# Patient Record
Sex: Male | Born: 1960 | Race: Black or African American | Hispanic: No | Marital: Married | Smoking: Never smoker
Health system: Southern US, Community
[De-identification: ages and names within clinical notes are randomized; demographics above are authoritative.]

---

## 2004-12-31 ENCOUNTER — Emergency Department (HOSPITAL_COMMUNITY): Admission: EM | Admit: 2004-12-31 | Discharge: 2004-12-31 | Payer: Self-pay | Admitting: Emergency Medicine

## 2011-12-15 ENCOUNTER — Other Ambulatory Visit: Payer: Self-pay

## 2013-03-01 ENCOUNTER — Encounter: Payer: Self-pay | Admitting: Family Medicine

## 2013-03-17 ENCOUNTER — Encounter: Payer: Self-pay | Admitting: Family Medicine

## 2013-03-17 ENCOUNTER — Ambulatory Visit (INDEPENDENT_AMBULATORY_CARE_PROVIDER_SITE_OTHER): Payer: 59 | Admitting: Family Medicine

## 2013-03-17 VITALS — BP 104/65 | HR 86 | Temp 98.4°F | Ht 69.0 in | Wt 171.2 lb

## 2013-03-17 DIAGNOSIS — Z13220 Encounter for screening for lipoid disorders: Secondary | ICD-10-CM

## 2013-03-17 DIAGNOSIS — Z8042 Family history of malignant neoplasm of prostate: Secondary | ICD-10-CM | POA: Insufficient documentation

## 2013-03-17 DIAGNOSIS — Z119 Encounter for screening for infectious and parasitic diseases, unspecified: Secondary | ICD-10-CM

## 2013-03-17 DIAGNOSIS — Z0000 Encounter for general adult medical examination without abnormal findings: Secondary | ICD-10-CM | POA: Insufficient documentation

## 2013-03-17 DIAGNOSIS — B353 Tinea pedis: Secondary | ICD-10-CM

## 2013-03-17 LAB — POCT CBC
Granulocyte percent: 76.3 %G (ref 37–80)
HCT, POC: 47.7 % (ref 43.5–53.7)
Hemoglobin: 16.3 g/dL (ref 14.1–18.1)
Lymph, poc: 1 (ref 0.6–3.4)
MCH, POC: 28.6 pg (ref 27–31.2)
MCHC: 34.2 g/dL (ref 31.8–35.4)
MCV: 83.8 fL (ref 80–97)
MPV: 7.6 fL (ref 0–99.8)
POC Granulocyte: 3.7 (ref 2–6.9)
POC LYMPH PERCENT: 21.3 %L (ref 10–50)
Platelet Count, POC: 251 10*3/uL (ref 142–424)
RBC: 5.7 M/uL (ref 4.69–6.13)
RDW, POC: 13.1 %
WBC: 4.9 10*3/uL (ref 4.6–10.2)

## 2013-03-17 MED ORDER — KETOCONAZOLE 2 % EX CREA: TOPICAL_CREAM | Freq: Two times a day (BID) | CUTANEOUS | Status: DC

## 2013-03-17 NOTE — Progress Notes (Signed)
Patient ID: Terry Novak, male   DOB: 1960/07/16, 52 y.o.   MRN: [id number] SUBJECTIVE: CC: Chief Complaint  Patient presents with  . 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 Thinks he may have strained his lower abdomen  History reviewed. No pertinent past medical history. History reviewed. No pertinent past surgical history. History   Social History  . Marital Status: Married    Spouse Name: N/A    Number of Children: N/A  . Years of Education: N/A   Occupational History  . Not on file.   Social History Main Topics  . Smoking status: Never Smoker   . Smokeless tobacco: Not on file  . Alcohol Use: No  . Drug Use: No  . Sexual Activity: Not on file   Other Topics Concern  . Not on file   Social History Narrative  . No narrative on file   Family History  Problem Relation Age of Onset  . Diabetes Mother   . Heart disease Mother    No current outpatient prescriptions on file prior to visit.   No current facility-administered medications on file prior to visit.   Allergies  Allergen Reactions  . Penicillins Rash   Immunization History  Administered Date(s) Administered  . Td 06/30/2010   Prior to Admission medications   Not on File    ROS: As above in the HPI. All other systems are stable or negative.  OBJECTIVE: APPEARANCE:  Patient in no acute distress.The patient appeared well nourished and normally developed. Acyanotic. Waist: VITAL SIGNS:BP 104/65  Pulse 86  Temp(Src) 98.4 F (36.9 C) (Oral)  Ht 5\' 9"  (1.753 m)  Wt 171 lb 3.2 oz (77.656 kg)  BMI 25.27 kg/m2 AAM  SKIN: warm and  Dry without overt rashes, tattoos. Scars from areas of eczema in the past: back of the neck lower extremities and between the shoulder blades. Macerated areas in the webspaces 4th bilaterally reflective of tinea pedis.  HEAD and Neck: without JVD, Head and scalp: normal Eyes:No scleral icterus. Fundi  normal, eye movements normal. Ears: Auricle normal, canal normal, Tympanic membranes normal, insufflation normal. Nose: normal Throat: normal Neck & thyroid: normal  CHEST & LUNGS: Chest wall: normal Lungs: Clear  CVS: Reveals the PMI to be normally located. Regular rhythm, First and Second Heart sounds are normal,  absence of murmurs, rubs or gallops. Peripheral vasculature: Radial pulses: normal Dorsal pedis pulses: normal Posterior pulses: normal  ABDOMEN:  Appearance: normal Benign, no organomegaly, no masses, no Abdominal Aortic enlargement. No Guarding , no rebound. No Bruits. Bowel sounds: normal  RECTAL: Normal: rectal 1+ enlarged heme negative GU: Normal  EXTREMETIES: nonedematous.  MUSCULOSKELETAL:  Spine: normal Joints: intact  NEUROLOGIC: oriented to time,place and person; nonfocal. Strength is normal Sensory is normal Reflexes are normal Cranial Nerves are normal.  ASSESSMENT: Annual physical exam - Plan: CMP14+EGFR, POCT CBC  Tinea pedis - Plan: ketoconazole (NIZORAL) 2 % cream  Family history of prostate cancer - Plan: PSA, total and free  Screening for cholesterol level - Plan: Lipid panel  Screening examination for infectious disease - Plan: Hepatitis C antibody   PLAN:      HEALTH MAINTENANCE Immunizations: Tetanus-Diphtheria Booster due: 2022 Pertusis Booster [id number] Flu Shot Due: every Fall Pneumonia Vaccine: usually at 53 years of age unless there are certain risk situations. Herpes Zoster/Shingles Vaccine due: usually at 53 years of age HPV AOZ:HYQM age 6 to 52 years in males and females.  Healthy Life Habits: Exercise Goal: 5-6 days/week; start gradually(ie 30 minutes/3days per week) 2 days weight training and 3-4 days cardio exercise. Nutrition: Balanced healthy meals including Vegetables and Fruits. Consider  Reading the following books: 1) Eat to Live by Dr Ottis Stain; 2) Prevent and Reverse Heart Disease by Dr Suzzette Righter. 3) The [place] Study  Vitamins:a Multivitamin is okay Aspirin:? Await lab work Stop Tobacco Use: Seat Belt Use:+++ recommended Sunscreen Use:+++ recommended Osteoporosis Prevention: 1) Exercise   Recommended Screening Tests: Colon Cancer Screening: due Blood work: today,psa Cholesterol Screening:  today        HIV:      ?   declined           Hepatitis C(people born 1945-1965):today   Monthly Self Testicular Exam:++++  Eye Exam: every 1 to 2 years recommended Dental Health: at least every 6 months  Others:    Living Will/Healthcare Power of Attorney: should have this in order with your personal estate planning  Return in about 1 year (around 03/17/2014) for PEX.  Wright Gravely P. Modesto Charon, M.D.

## 2013-03-17 NOTE — Patient Instructions (Addendum)
HEALTH MAINTENANCE Immunizations: Tetanus-Diphtheria Booster due: 2022 Pertusis Booster [id number] Flu Shot Due: every Fall Pneumonia Vaccine: usually at 53 years of age unless there are certain risk situations. Herpes Zoster/Shingles Vaccine due: usually at 53 years of age HPV JYN:WGNF age 52 to 19 years in males and females.  Healthy Life Habits: Exercise Goal: 5-6 days/week; start gradually(ie 30 minutes/3days per week) 2 days weight training and 3-4 days cardio exercise. Nutrition: Balanced healthy meals including Vegetables and Fruits. Consider  Reading the following books: 1) Eat to Live by Dr Ottis Stain; 2) Prevent and Reverse Heart Disease by Dr Suzzette Righter. 3) The [place] Study  Vitamins:a Multivitamin is okay Aspirin:? Await lab work Stop Tobacco Use: Seat Belt Use:+++ recommended Sunscreen Use:+++ recommended Osteoporosis Prevention: 1) Exercise   Recommended Screening Tests: Colon Cancer Screening: due Blood work: today,psa Cholesterol Screening:  today        HIV:      ?              Hepatitis C(people born 1945-1965):today   Monthly Self Testicular Exam:++++  Eye Exam: every 1 to 2 years recommended Dental Health: at least every 6 months  Others:    Living Will/Healthcare Power of Attorney: should have this in order with your personal estate planning

## 2013-03-18 ENCOUNTER — Other Ambulatory Visit: Payer: Self-pay | Admitting: Family Medicine

## 2013-03-18 DIAGNOSIS — R972 Elevated prostate specific antigen [PSA]: Secondary | ICD-10-CM

## 2013-03-18 LAB — CMP14+EGFR
ALT: 30 IU/L (ref 0–44)
AST: 26 IU/L (ref 0–40)
Albumin/Globulin Ratio: 1.9 (ref 1.1–2.5)
Albumin: 5 g/dL (ref 3.5–5.5)
Alkaline Phosphatase: 73 IU/L (ref 39–117)
BUN/Creatinine Ratio: 13 (ref 9–20)
BUN: 14 mg/dL (ref 6–24)
CO2: 28 mmol/L (ref 18–29)
Calcium: 9.9 mg/dL (ref 8.7–10.2)
Chloride: 99 mmol/L (ref 97–108)
Creatinine, Ser: 1.07 mg/dL (ref 0.76–1.27)
GFR calc Af Amer: 91 mL/min/{1.73_m2} (ref >59)
GFR calc non Af Amer: 79 mL/min/{1.73_m2} (ref >59)
Globulin, Total: 2.7 g/dL (ref 1.5–4.5)
Glucose: 98 mg/dL (ref 65–99)
Potassium: 4.7 mmol/L (ref 3.5–5.2)
Sodium: 142 mmol/L (ref 134–144)
Total Bilirubin: 1.3 mg/dL — ABNORMAL HIGH (ref 0.0–1.2)
Total Protein: 7.7 g/dL (ref 6.0–8.5)

## 2013-03-18 LAB — PSA, TOTAL AND FREE
PSA, Free Pct: 13.1 %
PSA, Free: 0.55 ng/mL
PSA: 4.2 ng/mL — ABNORMAL HIGH (ref 0.0–4.0)

## 2013-03-18 LAB — LIPID PANEL
Chol/HDL Ratio: 3.8 ratio units (ref 0.0–5.0)
Cholesterol, Total: 211 mg/dL — ABNORMAL HIGH (ref 100–199)
HDL: 56 mg/dL (ref >39)
LDL Calculated: 142 mg/dL — ABNORMAL HIGH (ref 0–99)
Triglycerides: 67 mg/dL (ref 0–149)
VLDL Cholesterol Cal: 13 mg/dL (ref 5–40)

## 2013-03-18 LAB — HEPATITIS C ANTIBODY: Hep C Virus Ab: <0.1 s/co ratio (ref 0.0–0.9)

## 2013-03-18 NOTE — Progress Notes (Signed)
Quick Note:  Labs abnormal. Cholesterol a little high. Needs to get those books recommended and change diet and see me in 6 months to recheck the lipids. Also the PSA is a little high Needs to see a urologist for evaluation. Will refer to Dr Brunilda Payor. ______

## 2013-03-22 ENCOUNTER — Telehealth: Payer: Self-pay | Admitting: Family Medicine

## 2013-03-22 NOTE — Telephone Encounter (Signed)
Pt aware of labs and results released to my chart

## 2013-03-22 NOTE — Telephone Encounter (Signed)
Pt already advised of results.

## 2013-05-02 ENCOUNTER — Telehealth: Payer: Self-pay | Admitting: Family Medicine

## 2013-05-02 NOTE — Telephone Encounter (Signed)
PT NOTIFIED RX SENT IN ELECTRONICALLY ON 03/17/13 BY DR. Modesto Charon

## 2013-05-05 ENCOUNTER — Other Ambulatory Visit: Payer: Self-pay

## 2019-09-08 ENCOUNTER — Other Ambulatory Visit: Payer: Self-pay

## 2019-09-08 ENCOUNTER — Ambulatory Visit (INDEPENDENT_AMBULATORY_CARE_PROVIDER_SITE_OTHER): Payer: BC Managed Care – PPO | Admitting: Internal Medicine

## 2019-09-08 ENCOUNTER — Encounter (INDEPENDENT_AMBULATORY_CARE_PROVIDER_SITE_OTHER): Payer: Self-pay | Admitting: Internal Medicine

## 2019-09-08 VITALS — BP 130/85 | HR 96 | Temp 98.8°F | Ht 70.0 in | Wt 183.0 lb

## 2019-09-08 DIAGNOSIS — E559 Vitamin D deficiency, unspecified: Secondary | ICD-10-CM

## 2019-09-08 DIAGNOSIS — Z131 Encounter for screening for diabetes mellitus: Secondary | ICD-10-CM

## 2019-09-08 DIAGNOSIS — R5381 Other malaise: Secondary | ICD-10-CM | POA: Diagnosis not present

## 2019-09-08 DIAGNOSIS — R972 Elevated prostate specific antigen [PSA]: Secondary | ICD-10-CM | POA: Diagnosis not present

## 2019-09-08 DIAGNOSIS — R5383 Other fatigue: Secondary | ICD-10-CM

## 2019-09-08 DIAGNOSIS — R079 Chest pain, unspecified: Secondary | ICD-10-CM | POA: Diagnosis not present

## 2019-09-08 DIAGNOSIS — Z13220 Encounter for screening for lipoid disorders: Secondary | ICD-10-CM

## 2019-09-08 NOTE — Progress Notes (Signed)
Metrics: Intervention Frequency ACO  Documented Smoking Status Yearly  Screened one or more times in 24 months  Cessation Counseling or  Active cessation medication Past 24 months  Past 24 months   Guideline developer: UpToDate (See UpToDate for funding source) Date Released: 2014       Wellness Office Visit  Subjective:  Patient ID: Terry Novak, male    DOB: [birth year]  Age: 59 y.o. MRN: [id number]  CC: This 59 year old man comes to our practice as a new patient to be established. He recently had chest pain that he is somewhat concerned about.  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  Each time he had the pain it lasted only a few minutes and the severity of the pain was 2 out of 10.  It was not associated with dyspnea, sweating, nausea or vomiting.  There was no radiation of the pain.  He was concerned that he went to the urgent care.  Apparently her D-dimer was elevated so he was told to go to the emergency room, [facility].  D-dimer the hospital was normal.  He proceeded to have this intermittent chest pain for the next couple of days over the weekend and then he has not had any further. 2 days prior to the onset of the pain, he had a heavy strength training workout. There is a family history of diabetes and heart disease in his mother.  He wanted to make sure he was not having cardiac problems. In terms of his general health, he tells me he has had a history of elevated PSA more than 5 years ago and eventually had a prostate biopsy which was negative.  History reviewed. No pertinent past medical history.    Family History  Problem Relation Age of Onset  . Diabetes Mother   . Heart disease Mother   . Stroke Mother   . Migraines Father   . 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   Social History   Tobacco Use  . Smoking status: Never Smoker  . Smokeless tobacco: Never Used  Substance Use Topics  . Alcohol use: Yes    Alcohol/week: 1.0 standard drinks    Types: 1 Glasses of wine per week    Comment: weekend occasional    No outpatient medications have been marked as taking for the 09/08/19 encounter (Office Visit) with Doree Albee, MD.       Objective:   Today's Vitals: BP 130/85 (BP Location: Left Arm, Patient Position: Sitting, Cuff Size: Normal)   Pulse 96   Temp 98.8 F (37.1 C) (Temporal)   Ht 5\' 10"  (1.778 m)   Wt 183 lb (83 kg)   SpO2 96%   BMI 26.26 kg/m  Vitals with BMI 09/08/2019 03/17/2013  Height 5\' 10"  5\' 9"   Weight 183 lbs 171 lbs 3 oz  BMI [id number]  Systolic [id number]  Diastolic 85 65  Pulse 96 86     Physical Exam   He looks systemically well.  Heart sounds are present without gallop rhythm.  There are no murmurs.  Diastolic blood pressure slightly elevated.  There is no anterior chest wall tenderness.  Lung fields are clear with no pleural rub.  There  is no neck lymphadenopathy.    Assessment   1. Elevated PSA   2. Chest pain, unspecified type   3. Vitamin D deficiency disease   4. Malaise and fatigue   5. Screening for diabetes mellitus   6. Screening for lipoid disorders       Tests ordered Orders Placed This Encounter  Procedures  . COMPLETE METABOLIC PANEL WITH GFR  . Hemoglobin A1c  . Lipid panel  . PSA, total and free  . T3, free  . T4  . TSH  . VITAMIN D 25 Hydroxy (Vit-D Deficiency, Fractures)     Plan: 1. An EKG was done in the office which shows normal sinus rhythm without any acute ST-T wave changes. 2. Blood work is taken above. 3. Further recommendations will depend on these results and I will see him for follow-up in the next few weeks to discuss all these results. 4. I spent 30 minutes with this patient today discussing his  symptoms.   No orders of the defined types were placed in this encounter.   Doree Albee, MD

## 2019-09-09 LAB — LIPID PANEL
Cholesterol: 203 mg/dL — ABNORMAL HIGH (ref <200)
HDL: 44 mg/dL (ref >=40)
LDL Cholesterol (Calc): 122 mg/dL (calc) — ABNORMAL HIGH
Non-HDL Cholesterol (Calc): 159 mg/dL (calc) — ABNORMAL HIGH (ref <130)
Total CHOL/HDL Ratio: 4.6 (calc) (ref <5.0)
Triglycerides: 260 mg/dL — ABNORMAL HIGH (ref <150)

## 2019-09-09 LAB — TSH: TSH: 1.17 mIU/L (ref 0.40–4.50)

## 2019-09-09 LAB — PSA, TOTAL AND FREE
PSA, Free: 1.3 ng/mL
PSA, Total: 16.8 ng/mL — ABNORMAL HIGH (ref <=4.0)

## 2019-09-09 LAB — COMPLETE METABOLIC PANEL WITH GFR
AG Ratio: 1.6 (calc) (ref 1.0–2.5)
ALT: 50 U/L — ABNORMAL HIGH (ref 9–46)
AST: 32 U/L (ref 10–35)
Albumin: 4.6 g/dL (ref 3.6–5.1)
Alkaline phosphatase (APISO): 87 U/L (ref 35–144)
BUN: 13 mg/dL (ref 7–25)
CO2: 32 mmol/L (ref 20–32)
Calcium: 9.5 mg/dL (ref 8.6–10.3)
Chloride: 99 mmol/L (ref 98–110)
Creat: 1.16 mg/dL (ref 0.70–1.33)
GFR, Est African American: 79 mL/min/{1.73_m2} (ref >=60)
GFR, Est Non African American: 69 mL/min/{1.73_m2} (ref >=60)
Globulin: 2.9 g/dL (calc) (ref 1.9–3.7)
Glucose, Bld: 82 mg/dL (ref 65–99)
Potassium: 4 mmol/L (ref 3.5–5.3)
Sodium: 138 mmol/L (ref 135–146)
Total Bilirubin: 0.9 mg/dL (ref 0.2–1.2)
Total Protein: 7.5 g/dL (ref 6.1–8.1)

## 2019-09-09 LAB — T3, FREE: T3, Free: 3 pg/mL (ref 2.3–4.2)

## 2019-09-09 LAB — T4: T4, Total: 7 ug/dL (ref 4.9–10.5)

## 2019-09-09 LAB — HEMOGLOBIN A1C
Hgb A1c MFr Bld: 5.7 % of total Hgb — ABNORMAL HIGH (ref <5.7)
Mean Plasma Glucose: 117 (calc)
eAG (mmol/L): 6.5 (calc)

## 2019-09-09 LAB — VITAMIN D 25 HYDROXY (VIT D DEFICIENCY, FRACTURES): Vit D, 25-Hydroxy: 17 ng/mL — ABNORMAL LOW (ref 30–100)

## 2019-09-12 ENCOUNTER — Other Ambulatory Visit (INDEPENDENT_AMBULATORY_CARE_PROVIDER_SITE_OTHER): Payer: Self-pay | Admitting: Internal Medicine

## 2019-09-12 ENCOUNTER — Ambulatory Visit (INDEPENDENT_AMBULATORY_CARE_PROVIDER_SITE_OTHER): Payer: Self-pay | Admitting: Internal Medicine

## 2019-09-12 ENCOUNTER — Telehealth (INDEPENDENT_AMBULATORY_CARE_PROVIDER_SITE_OTHER): Payer: Self-pay

## 2019-09-12 ENCOUNTER — Other Ambulatory Visit (INDEPENDENT_AMBULATORY_CARE_PROVIDER_SITE_OTHER): Payer: Self-pay

## 2019-09-12 DIAGNOSIS — R972 Elevated prostate specific antigen [PSA]: Secondary | ICD-10-CM

## 2019-09-12 NOTE — Telephone Encounter (Signed)
Notified pt that his lab results for PSA is elevated to 16.8 and I will be sending a Referral to Dr Irine Seal office in [place] to be seen for a consult. I have sent over referral to [facility] today.

## 2019-09-12 NOTE — Progress Notes (Signed)
Patient called.  Left voicemail to contact office.

## 2019-09-12 NOTE — Progress Notes (Signed)
Please call this patient today and let him know that his PSA level is extremely elevated and I am concerned about the possibility of prostate cancer. Please refer him today to urology for further evaluation of elevated PSA. I will put the order in for the referral.

## 2019-09-12 NOTE — Telephone Encounter (Signed)
DONE

## 2019-09-16 ENCOUNTER — Other Ambulatory Visit: Payer: Self-pay | Admitting: Urology

## 2019-09-16 DIAGNOSIS — Z8042 Family history of malignant neoplasm of prostate: Secondary | ICD-10-CM

## 2019-09-16 DIAGNOSIS — R972 Elevated prostate specific antigen [PSA]: Secondary | ICD-10-CM

## 2019-09-26 ENCOUNTER — Ambulatory Visit (INDEPENDENT_AMBULATORY_CARE_PROVIDER_SITE_OTHER): Payer: Self-pay | Admitting: Internal Medicine

## 2019-10-13 ENCOUNTER — Ambulatory Visit (INDEPENDENT_AMBULATORY_CARE_PROVIDER_SITE_OTHER): Payer: BC Managed Care – PPO | Admitting: Internal Medicine

## 2019-10-15 ENCOUNTER — Other Ambulatory Visit: Payer: Self-pay

## 2019-10-15 ENCOUNTER — Ambulatory Visit
Admission: RE | Admit: 2019-10-15 | Discharge: 2019-10-15 | Disposition: A | Discharge status: Home / Self Care | Payer: BC Managed Care – PPO | Source: Ambulatory Visit | Attending: Urology | Admitting: Urology

## 2019-10-15 DIAGNOSIS — R972 Elevated prostate specific antigen [PSA]: Secondary | ICD-10-CM

## 2019-10-15 DIAGNOSIS — Z8042 Family history of malignant neoplasm of prostate: Secondary | ICD-10-CM

## 2019-10-15 MED ORDER — GADOBENATE DIMEGLUMINE 529 MG/ML IV SOLN
17.0000 mL | Freq: Once | INTRAVENOUS | Status: AC | PRN
  Administered 2019-10-15: 17 mL via INTRAVENOUS

## 2019-10-19 ENCOUNTER — Telehealth (INDEPENDENT_AMBULATORY_CARE_PROVIDER_SITE_OTHER): Payer: Self-pay

## 2019-10-19 NOTE — Telephone Encounter (Signed)
The report that I am reading actually suggest he does not have prostate cancer.  I do not mind doing a telemedicine visit but the urologist is the person who should be telling him about the MRI result and making further recommendations.  From my reading, he does not have prostate cancer and has BPH.

## 2019-10-19 NOTE — Telephone Encounter (Signed)
Dr Irine Seal

## 2019-10-19 NOTE — Telephone Encounter (Signed)
Terry Novak,pt  ask if you would send a message on his behalf. (cause all this medical terms  he don't quite understand) He said he does not want to do a biopsy procedure if he really does not need. Reason is because if it is no Cancer, then lets do other treatment instead or monitor. If possible, does not want to be a bill for this if he does not need. Just feel better if Dr Anastasio Champion will talk to Urologist for him.  Will we let him know the outcome. Pt is very nervious and unsure of information on his own.

## 2019-10-19 NOTE — Telephone Encounter (Signed)
Okay, I need the urologist name and phone number and I will call him.

## 2019-11-17 ENCOUNTER — Other Ambulatory Visit: Payer: Self-pay

## 2019-11-17 ENCOUNTER — Encounter (INDEPENDENT_AMBULATORY_CARE_PROVIDER_SITE_OTHER): Payer: Self-pay | Admitting: Internal Medicine

## 2019-11-17 ENCOUNTER — Ambulatory Visit (INDEPENDENT_AMBULATORY_CARE_PROVIDER_SITE_OTHER): Payer: BC Managed Care – PPO | Admitting: Internal Medicine

## 2019-11-17 VITALS — BP 124/88 | HR 94 | Temp 97.2°F | Ht 71.0 in | Wt 183.6 lb

## 2019-11-17 DIAGNOSIS — E559 Vitamin D deficiency, unspecified: Secondary | ICD-10-CM | POA: Diagnosis not present

## 2019-11-17 DIAGNOSIS — R972 Elevated prostate specific antigen [PSA]: Secondary | ICD-10-CM | POA: Diagnosis not present

## 2019-11-17 DIAGNOSIS — R7303 Prediabetes: Secondary | ICD-10-CM

## 2019-11-17 DIAGNOSIS — Z1211 Encounter for screening for malignant neoplasm of colon: Secondary | ICD-10-CM

## 2019-11-17 NOTE — Progress Notes (Signed)
Metrics: Intervention Frequency ACO  Documented Smoking Status Yearly  Screened one or more times in 24 months  Cessation Counseling or  Active cessation medication Past 24 months  Past 24 months   Guideline developer: UpToDate (See UpToDate for funding source) Date Released: 2014       Wellness Office Visit  Subjective:  Patient ID: Terry Novak, male    DOB: 07-25-1960  Age: 59 y.o. MRN: [id number]  CC: This man comes in to review all his blood work and further recommendations. HPI  I have tried seen him for the first visit, the most pressing problem was an elevated PSA.  He eventually went to see a urologist and had a biopsy which thankfully was negative.  He also had an MRI of the prostate which did not show any evidence of prostate cancer. He is here today to also discuss the remaining of his blood work.  I discussed all the blood results with him.  He has vitamin D deficiency. He is prediabetic with a hemoglobin A1c of 5.7%. He also has mild hyperlipidemia which does not require statin therapy. Remaining blood work is okay. History reviewed. No pertinent past medical history.    Family History  Problem Relation Age of Onset  . Diabetes Mother   . Heart disease Mother   . Stroke Mother   . Migraines Father   . 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   Social History   Tobacco Use  . Smoking status: Never Smoker  . Smokeless tobacco: Never Used  Substance Use Topics  . Alcohol use: Yes    Alcohol/week: 1.0 standard drinks    Types: 1 Glasses of wine per week    Comment: weekend occasional    No outpatient medications have been marked as taking for the 11/17/19 encounter (Office Visit) with Doree Albee, MD.      Depression screen [facility] 2/9 09/08/2019  Decreased Interest 0  Down, Depressed, Hopeless 0  PHQ - 2 Score 0      Objective:   Today's Vitals: BP 124/88 (BP Location: Left Arm, Patient Position: Sitting, Cuff Size: Normal)   Pulse 94   Temp (!) 97.2 F (36.2 C) (Temporal)   Ht 5\' 11"  (1.803 m)   Wt 183 lb 9.6 oz (83.3 kg)   SpO2 96%   BMI 25.61 kg/m  Vitals with BMI 11/17/2019 09/08/2019 03/17/2013  Height 5\' 11"  5\' 10"  5\' 9"   Weight 183 lbs 10 oz 183 lbs 171 lbs 3 oz  BMI 25.62 [id number]  Systolic [id number]  Diastolic 88 85 65  Pulse 94 96 86     Physical Exam   He looks systemically well.  Weight is unchanged.  Blood pressure is in reasonable control although diastolic slightly elevated for him.  He is alert and orientated without any focal neurological signs.    Assessment   1. Vitamin D deficiency disease   2. Elevated PSA   3. Prediabetes   4. Colon cancer screening       Tests ordered Orders Placed This Encounter  Procedures  . Ambulatory referral to Gastroenterology     Plan: 1. I recommended he start taking vitamin D3 10,000 units daily. 2. As far as his elevated PSA is concerned, he will follow-up with urology. 3. We discussed his prediabetes and his  mother had diabetes also and his desire to avoid diabetes.  We discussed nutrition at length and I introduced him to the concept of intermittent fasting combined with a plant-based diet.  He will try to do this. 4. I will refer him to gastroenterology for colonoscopy. 5. I will see him in about 3 months time and we will see how he is doing then and check blood work again. 6. Today I spent 30 minutes with this patient discussing all of the above.   No orders of the defined types were placed in this encounter.   Doree Albee, MD

## 2019-11-17 NOTE — Patient Instructions (Signed)
Terry Novak Terry Novak What to Avoid . Avoid added sugars o Often added sugar can be found in processed foods such as many condiments, dry cereals, cakes, cookies, chips, crisps, crackers, candies, sweetened drinks, etc.  o Read labels and AVOID/DECREASE use of foods with the following in their ingredient list: Sugar, fructose, high fructose corn syrup, sucrose, glucose, maltose, dextrose, molasses, cane sugar, brown sugar, any type of syrup, agave nectar, etc.   . Avoid snacking in between meals . Avoid foods made with flour o If you are going to eat food made with flour, choose those made with whole-grains; and, minimize your consumption as much as is tolerable . Avoid processed foods o These foods are generally stocked in the middle of the grocery store. Focus on shopping on the perimeter of the grocery.  . Avoid Meat  o We recommend following a plant-based diet at [facility]. Thus, we recommend avoiding meat as a general rule. Consider eating beans, legumes, eggs, and/or dairy products for regular protein sources o If you plan on eating meat limit to 4 ounces of meat at a time and choose lean options such as Fish, chicken, turkey. Avoid red meat intake such as pork and/or steak What to Include . Vegetables o GREEN LEAFY VEGETABLES: Kale, spinach, mustard greens, collard greens, cabbage, broccoli, etc. o OTHER: Asparagus, cauliflower, eggplant, carrots, peas, Brussel sprouts, tomatoes, bell peppers, zucchini, beets, cucumbers, etc. . Grains, seeds, and legumes o Beans: kidney beans, black eyed peas, garbanzo beans, black beans, pinto beans, etc. o Whole, unrefined grains: brown rice, barley, bulgur, oatmeal, etc. . Healthy fats  o Avoid highly processed fats such as vegetable oil o Examples of healthy fats: avocado, olives, virgin olive oil, dark chocolate (?72% Cocoa), nuts (peanuts, almonds, walnuts, cashews, pecans, etc.) . None to Low  Intake of Animal Sources of Protein o Meat sources: chicken, turkey, salmon, tuna. Limit to 4 ounces of meat at one time. o Consider limiting dairy sources, but when choosing dairy focus on: PLAIN Greek yogurt, cottage cheese, high-protein milk . Fruit o Choose berries  When to Eat . Intermittent Fasting: o Choosing not to eat for a specific time period, but DO FOCUS ON HYDRATION when fasting o Multiple Techniques: - Time Restricted Eating: eat 3 meals in a day, each meal lasting no more than 60 minutes, no snacks between meals - 16-18 hour fast: fast for 16 to 18 hours up to 7 days a week. Often suggested to start with 2-3 nonconsecutive days per week.  . Remember the time you sleep is counted as fasting.  . Examples of eating schedule: Fast from 7:00pm-11:00am. Eat between 11:00am-7:00pm.  - 24-hour fast: fast for 24 hours up to every other day. Often suggested to start with 1 day per week . Remember the time you sleep is counted as fasting . Examples of eating schedule:  o Eating day: eat 2-3 meals on your eating day. If doing 2 meals, each meal should last no more than 90 minutes. If doing 3 meals, each meal should last no more than 60 minutes. Finish last meal by 7:00pm. o Fasting day: Fast until 7:00pm.  o IF YOU FEEL UNWELL FOR ANY REASON/IN ANY WAY WHEN FASTING, STOP FASTING BY EATING A NUTRITIOUS SNACK OR LIGHT MEAL o ALWAYS FOCUS ON HYDRATION DURING FASTS - Acceptable Hydration sources: water, broths, tea/coffee (black tea/coffee is best but using a small amount of whole-fat dairy products in coffee/tea is acceptable).  -   Poor Hydration Sources: anything with sugar or artificial sweeteners added to it  These recommendations have been developed for patients that are actively receiving medical care from either Terry Novak or Terry Gray, DNP, NP-C at [facility]. These recommendations are developed for patients with specific medical conditions and are not meant to be  distributed or used by others that are not actively receiving care from either provider listed above at [name]. It is not appropriate to participate in the above eating plans without proper medical supervision.   Reference: Fung, J. The obesity code. [place]: [place]; 2016.   

## 2019-11-21 ENCOUNTER — Encounter (INDEPENDENT_AMBULATORY_CARE_PROVIDER_SITE_OTHER): Payer: Self-pay | Admitting: *Deleted

## 2020-02-20 ENCOUNTER — Ambulatory Visit (INDEPENDENT_AMBULATORY_CARE_PROVIDER_SITE_OTHER): Payer: BC Managed Care – PPO | Admitting: Internal Medicine

## 2020-04-02 ENCOUNTER — Telehealth (INDEPENDENT_AMBULATORY_CARE_PROVIDER_SITE_OTHER): Payer: Self-pay

## 2020-04-02 ENCOUNTER — Encounter (INDEPENDENT_AMBULATORY_CARE_PROVIDER_SITE_OTHER): Payer: Self-pay | Admitting: Internal Medicine

## 2020-07-18 ENCOUNTER — Encounter (INDEPENDENT_AMBULATORY_CARE_PROVIDER_SITE_OTHER): Payer: Self-pay | Admitting: Internal Medicine

## 2020-07-26 ENCOUNTER — Encounter (INDEPENDENT_AMBULATORY_CARE_PROVIDER_SITE_OTHER): Payer: Self-pay | Admitting: Internal Medicine

## 2020-07-26 ENCOUNTER — Other Ambulatory Visit: Payer: Self-pay

## 2020-07-26 ENCOUNTER — Ambulatory Visit (INDEPENDENT_AMBULATORY_CARE_PROVIDER_SITE_OTHER): Payer: BC Managed Care – PPO | Admitting: Internal Medicine

## 2020-07-26 VITALS — BP 132/80 | HR 112 | Temp 96.1°F | Ht 71.0 in | Wt 173.6 lb

## 2020-07-26 DIAGNOSIS — E559 Vitamin D deficiency, unspecified: Secondary | ICD-10-CM | POA: Diagnosis not present

## 2020-07-26 DIAGNOSIS — E785 Hyperlipidemia, unspecified: Secondary | ICD-10-CM | POA: Diagnosis not present

## 2020-07-26 DIAGNOSIS — R972 Elevated prostate specific antigen [PSA]: Secondary | ICD-10-CM | POA: Diagnosis not present

## 2020-07-26 DIAGNOSIS — Z1211 Encounter for screening for malignant neoplasm of colon: Secondary | ICD-10-CM

## 2020-07-26 DIAGNOSIS — R7303 Prediabetes: Secondary | ICD-10-CM | POA: Diagnosis not present

## 2020-07-26 NOTE — Progress Notes (Signed)
Metrics: Intervention Frequency ACO  Documented Smoking Status Yearly  Screened one or more times in 24 months  Cessation Counseling or  Active cessation medication Past 24 months  Past 24 months   Guideline developer: UpToDate (See UpToDate for funding source) Date Released: 2014       Wellness Office Visit  Subjective:  Patient ID: Terry Novak, male    DOB: [birth year]  Age: 60 y.o. MRN: [id number]  CC: This man comes in for follow-up of vitamin D deficiency, prediabetes, dyslipidemia and elevated PSA. HPI  Approximately 2 weeks ago he had COVID-19 disease and thankfully has made a good recovery.  He has had 2 doses of COVID-19 vaccine and is due to get the third dose now in about 2 to 3 weeks time is what I would recommend to him. He has been working hard on nutrition and as a result has lost 10 pounds in the last time I saw him in May of last year. He has been taking vitamin D3 5000 units daily for vitamin D deficiency. History reviewed. No pertinent past medical history. History reviewed. No pertinent surgical history.   Family History  Problem Relation Age of Onset  . Diabetes Mother   . Heart disease Mother   . Stroke Mother   . Migraines Father   . 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   Social History   Tobacco Use  . Smoking status: Never Smoker  . Smokeless tobacco: Never Used  Substance Use Topics  . Alcohol use: Yes    Alcohol/week: 1.0 standard drink    Types: 1 Glasses of wine per week    Comment: weekend occasional    Current Meds  Medication Sig  . Cholecalciferol (VITAMIN D-3) 125 MCG (5000 UT) TABS Take 1 tablet by mouth daily.      Depression screen [facility] 2/9 07/26/2020 09/08/2019  Decreased Interest 0 0  Down, Depressed, Hopeless 0 0  PHQ - 2 Score 0 0  Altered sleeping 0 -  Tired, decreased energy 0 -   Change in appetite 0 -  Feeling bad or failure about yourself  0 -  Trouble concentrating 0 -  Moving slowly or fidgety/restless 0 -  Suicidal thoughts 0 -  PHQ-9 Score 0 -  Difficult doing work/chores Not difficult at all -     Objective:   Today's Vitals: BP 132/80   Pulse (!) 112   Temp (!) 96.1 F (35.6 C) (Temporal)   Ht 5\' 11"  (1.803 m)   Wt 173 lb 9.6 oz (78.7 kg)   SpO2 98%   BMI 24.21 kg/m  Vitals with BMI 07/26/2020 11/17/2019 09/08/2019  Height 5\' 11"  5\' 11"  5\' 10"   Weight 173 lbs 10 oz 183 lbs 10 oz 183 lbs  BMI 24.22 01.02 72.53  Systolic 664 403 474  Diastolic 80 88 85  Pulse 259 94 96     Physical Exam  He looks systemically well.  He has lost 10 pound since last visit.  Blood pressure is in a good range.     Assessment   1. Vitamin D deficiency disease   2. Prediabetes   3. Elevated PSA   4. Dyslipidemia   5. Colon cancer screening       Tests ordered Orders Placed This Encounter  Procedures  . Lipid panel  .  Hemoglobin A1c  . COMPLETE METABOLIC PANEL WITH GFR  . VITAMIN D 25 Hydroxy (Vit-D Deficiency, Fractures)  . Ambulatory referral to Gastroenterology     Plan: 1. He will continue with vitamin D3 5000 units daily. 2. We will check blood work for A1c and lipid panel. 3. He needs a colonoscopy and we will refer him to the gastroenterologist. 4. Follow-up in about 3 to 4 months for an annual physical exam.   No orders of the defined types were placed in this encounter.   Doree Albee, MD

## 2020-07-27 ENCOUNTER — Encounter (INDEPENDENT_AMBULATORY_CARE_PROVIDER_SITE_OTHER): Payer: Self-pay | Admitting: *Deleted

## 2020-07-27 LAB — LIPID PANEL
Cholesterol: 223 mg/dL — ABNORMAL HIGH (ref <200)
HDL: 42 mg/dL (ref >=40)
LDL Cholesterol (Calc): 146 mg/dL (calc) — ABNORMAL HIGH
Non-HDL Cholesterol (Calc): 181 mg/dL (calc) — ABNORMAL HIGH (ref <130)
Total CHOL/HDL Ratio: 5.3 (calc) — ABNORMAL HIGH (ref <5.0)
Triglycerides: 211 mg/dL — ABNORMAL HIGH (ref <150)

## 2020-07-27 LAB — COMPLETE METABOLIC PANEL WITH GFR
AG Ratio: 1.4 (calc) (ref 1.0–2.5)
ALT: 47 U/L — ABNORMAL HIGH (ref 9–46)
AST: 25 U/L (ref 10–35)
Albumin: 4.5 g/dL (ref 3.6–5.1)
Alkaline phosphatase (APISO): 78 U/L (ref 35–144)
BUN/Creatinine Ratio: 13 (calc) (ref 6–22)
BUN: 16 mg/dL (ref 7–25)
CO2: 31 mmol/L (ref 20–32)
Calcium: 10 mg/dL (ref 8.6–10.3)
Chloride: 101 mmol/L (ref 98–110)
Creat: 1.28 mg/dL — ABNORMAL HIGH (ref 0.70–1.25)
GFR, Est African American: 70 mL/min/{1.73_m2} (ref >=60)
GFR, Est Non African American: 60 mL/min/{1.73_m2} (ref >=60)
Globulin: 3.3 g/dL (calc) (ref 1.9–3.7)
Glucose, Bld: 87 mg/dL (ref 65–139)
Potassium: 4.1 mmol/L (ref 3.5–5.3)
Sodium: 140 mmol/L (ref 135–146)
Total Bilirubin: 1.4 mg/dL — ABNORMAL HIGH (ref 0.2–1.2)
Total Protein: 7.8 g/dL (ref 6.1–8.1)

## 2020-07-27 LAB — HEMOGLOBIN A1C
Hgb A1c MFr Bld: 5.8 % of total Hgb — ABNORMAL HIGH (ref <5.7)
Mean Plasma Glucose: 120 mg/dL
eAG (mmol/L): 6.6 mmol/L

## 2020-07-27 LAB — VITAMIN D 25 HYDROXY (VIT D DEFICIENCY, FRACTURES): Vit D, 25-Hydroxy: 23 ng/mL — ABNORMAL LOW (ref 30–100)

## 2020-10-16 ENCOUNTER — Encounter (INDEPENDENT_AMBULATORY_CARE_PROVIDER_SITE_OTHER): Payer: Self-pay | Admitting: *Deleted

## 2020-10-16 ENCOUNTER — Telehealth (INDEPENDENT_AMBULATORY_CARE_PROVIDER_SITE_OTHER): Payer: Self-pay | Admitting: *Deleted

## 2020-10-16 ENCOUNTER — Other Ambulatory Visit (INDEPENDENT_AMBULATORY_CARE_PROVIDER_SITE_OTHER): Payer: Self-pay | Admitting: *Deleted

## 2020-10-16 MED ORDER — PEG 3350-KCL-NA BICARB-NACL 420 G PO SOLR: 4000.0000 mL | Freq: Once | ORAL | 0 refills | Status: AC

## 2020-10-16 NOTE — Telephone Encounter (Signed)
Patient needs trilyte 

## 2020-10-16 NOTE — Telephone Encounter (Signed)
done

## 2020-10-17 ENCOUNTER — Telehealth (INDEPENDENT_AMBULATORY_CARE_PROVIDER_SITE_OTHER): Payer: Self-pay | Admitting: *Deleted

## 2020-10-17 ENCOUNTER — Other Ambulatory Visit (INDEPENDENT_AMBULATORY_CARE_PROVIDER_SITE_OTHER): Payer: Self-pay

## 2020-10-17 DIAGNOSIS — Z1211 Encounter for screening for malignant neoplasm of colon: Secondary | ICD-10-CM

## 2020-10-17 NOTE — Telephone Encounter (Signed)
Referring MD/PCP: gosrani  Procedure: tcs  Reason/Indication:  screening  Has patient had this procedure before?  no  If so, when, by whom and where?    Is there a family history of colon cancer?  no  Who?  What age when diagnosed?    Is patient diabetic? If yes, Type 1 or Type 2   no      Does patient have prosthetic heart valve or mechanical valve?  no  Do you have a pacemaker/defibrillator?  no  Has patient ever had endocarditis/atrial fibrillation? no  Have you had a stroke/heart attack last 6 mths? no  Does patient use oxygen? no  Has patient had joint replacement within last 12 months?  no  Is patient constipated or do they take laxatives? no  Does patient have a history of alcohol/drug use?  no  Is patient on blood thinner such as Coumadin, Plavix and/or Aspirin? no  Do you take medicine for weight loss?  no  For male patients,: do you still have your menstrual cycle? n/a  Medications: none  Allergies:   Medication Adjustment per Dr Rehman/Dr Jenetta Downer   Procedure date & time: 11/15/20

## 2020-11-13 ENCOUNTER — Other Ambulatory Visit (HOSPITAL_COMMUNITY)
Admission: RE | Admit: 2020-11-13 | Discharge: 2020-11-13 | Disposition: A | Discharge status: Home / Self Care | Payer: BC Managed Care – PPO | Source: Ambulatory Visit | Attending: Internal Medicine | Admitting: Internal Medicine

## 2020-11-13 ENCOUNTER — Other Ambulatory Visit: Payer: Self-pay

## 2020-11-13 DIAGNOSIS — Z79899 Other long term (current) drug therapy: Secondary | ICD-10-CM | POA: Diagnosis not present

## 2020-11-13 DIAGNOSIS — Z01812 Encounter for preprocedural laboratory examination: Secondary | ICD-10-CM | POA: Insufficient documentation

## 2020-11-13 DIAGNOSIS — Z823 Family history of stroke: Secondary | ICD-10-CM | POA: Diagnosis not present

## 2020-11-13 DIAGNOSIS — Z880 Allergy status to penicillin: Secondary | ICD-10-CM | POA: Diagnosis not present

## 2020-11-13 DIAGNOSIS — D123 Benign neoplasm of transverse colon: Secondary | ICD-10-CM | POA: Diagnosis not present

## 2020-11-13 DIAGNOSIS — Z1211 Encounter for screening for malignant neoplasm of colon: Secondary | ICD-10-CM

## 2020-11-13 DIAGNOSIS — Z20822 Contact with and (suspected) exposure to covid-19: Secondary | ICD-10-CM | POA: Diagnosis not present

## 2020-11-13 DIAGNOSIS — Z8042 Family history of malignant neoplasm of prostate: Secondary | ICD-10-CM | POA: Diagnosis not present

## 2020-11-13 DIAGNOSIS — K621 Rectal polyp: Secondary | ICD-10-CM | POA: Diagnosis not present

## 2020-11-13 DIAGNOSIS — Z820 Family history of epilepsy and other diseases of the nervous system: Secondary | ICD-10-CM | POA: Diagnosis not present

## 2020-11-13 DIAGNOSIS — K644 Residual hemorrhoidal skin tags: Secondary | ICD-10-CM | POA: Diagnosis not present

## 2020-11-13 DIAGNOSIS — Z8249 Family history of ischemic heart disease and other diseases of the circulatory system: Secondary | ICD-10-CM | POA: Diagnosis not present

## 2020-11-13 DIAGNOSIS — D127 Benign neoplasm of rectosigmoid junction: Secondary | ICD-10-CM | POA: Diagnosis not present

## 2020-11-13 DIAGNOSIS — Z833 Family history of diabetes mellitus: Secondary | ICD-10-CM | POA: Diagnosis not present

## 2020-11-13 LAB — SARS CORONAVIRUS 2 (TAT 6-24 HRS): SARS Coronavirus 2: NEGATIVE

## 2020-11-15 ENCOUNTER — Encounter (HOSPITAL_COMMUNITY)
Admission: RE | Disposition: A | Discharge status: Home / Self Care | Payer: Self-pay | Source: Home / Self Care | Attending: Internal Medicine

## 2020-11-15 ENCOUNTER — Other Ambulatory Visit: Payer: Self-pay

## 2020-11-15 ENCOUNTER — Encounter (HOSPITAL_COMMUNITY): Payer: Self-pay | Admitting: Internal Medicine

## 2020-11-15 ENCOUNTER — Ambulatory Visit (HOSPITAL_COMMUNITY)
Admission: RE | Admit: 2020-11-15 | Discharge: 2020-11-15 | Disposition: A | Discharge status: Home / Self Care | Payer: BC Managed Care – PPO | Attending: Internal Medicine | Admitting: Internal Medicine

## 2020-11-15 DIAGNOSIS — Z833 Family history of diabetes mellitus: Secondary | ICD-10-CM | POA: Insufficient documentation

## 2020-11-15 DIAGNOSIS — D123 Benign neoplasm of transverse colon: Secondary | ICD-10-CM | POA: Diagnosis not present

## 2020-11-15 DIAGNOSIS — K621 Rectal polyp: Secondary | ICD-10-CM | POA: Insufficient documentation

## 2020-11-15 DIAGNOSIS — Z8249 Family history of ischemic heart disease and other diseases of the circulatory system: Secondary | ICD-10-CM | POA: Insufficient documentation

## 2020-11-15 DIAGNOSIS — Z823 Family history of stroke: Secondary | ICD-10-CM | POA: Insufficient documentation

## 2020-11-15 DIAGNOSIS — K644 Residual hemorrhoidal skin tags: Secondary | ICD-10-CM | POA: Insufficient documentation

## 2020-11-15 DIAGNOSIS — Z1211 Encounter for screening for malignant neoplasm of colon: Secondary | ICD-10-CM

## 2020-11-15 DIAGNOSIS — Z820 Family history of epilepsy and other diseases of the nervous system: Secondary | ICD-10-CM | POA: Insufficient documentation

## 2020-11-15 DIAGNOSIS — D127 Benign neoplasm of rectosigmoid junction: Secondary | ICD-10-CM | POA: Insufficient documentation

## 2020-11-15 DIAGNOSIS — Z8042 Family history of malignant neoplasm of prostate: Secondary | ICD-10-CM | POA: Insufficient documentation

## 2020-11-15 DIAGNOSIS — Z79899 Other long term (current) drug therapy: Secondary | ICD-10-CM | POA: Insufficient documentation

## 2020-11-15 DIAGNOSIS — Z880 Allergy status to penicillin: Secondary | ICD-10-CM | POA: Insufficient documentation

## 2020-11-15 DIAGNOSIS — Z20822 Contact with and (suspected) exposure to covid-19: Secondary | ICD-10-CM | POA: Insufficient documentation

## 2020-11-15 HISTORY — PX: POLYPECTOMY: SHX5525

## 2020-11-15 HISTORY — PX: COLONOSCOPY: SHX5424

## 2020-11-15 LAB — HM COLONOSCOPY

## 2020-11-15 SURGERY — COLONOSCOPY
Anesthesia: Moderate Sedation

## 2020-11-15 MED ORDER — SODIUM CHLORIDE 0.9 % IV SOLN: INTRAVENOUS | Status: DC

## 2020-11-15 MED ORDER — STERILE WATER FOR IRRIGATION IR SOLN
Status: DC | PRN
  Administered 2020-11-15: 2.5 mL

## 2020-11-15 MED ORDER — MEPERIDINE HCL 50 MG/ML IJ SOLN
INTRAMUSCULAR | Status: DC | PRN
  Administered 2020-11-15 (×2): 25 mg via INTRAVENOUS

## 2020-11-15 MED ORDER — MIDAZOLAM HCL 5 MG/5ML IJ SOLN
INTRAMUSCULAR | Status: AC
  Filled 2020-11-15: qty 10

## 2020-11-15 MED ORDER — MIDAZOLAM HCL 5 MG/5ML IJ SOLN
INTRAMUSCULAR | Status: DC | PRN
  Administered 2020-11-15: 3 mg via INTRAVENOUS
  Administered 2020-11-15: 2 mg via INTRAVENOUS

## 2020-11-15 MED ORDER — MEPERIDINE HCL 50 MG/ML IJ SOLN
INTRAMUSCULAR | Status: AC
  Filled 2020-11-15: qty 1

## 2020-11-15 NOTE — Discharge Instructions (Signed)
No aspirin or NSAIDs for 24 hours. Resume usual medications and diet as before. No driving for 24 hours. Physician will call with biopsy results and further recommendations.   Colon Polyps  Colon polyps are tissue growths inside the colon, which is part of the large intestine. They are one of the types of polyps that can grow in the body. A polyp may be a round bump or a mushroom-shaped growth. You could have one polyp or more than one. Most colon polyps are noncancerous (benign). However, some colon polyps can become cancerous over time. Finding and removing the polyps early can help prevent this. What are the causes? The exact cause of colon polyps is not known. What increases the risk? The following factors may make you more likely to develop this condition:  Having a family history of colorectal cancer or colon polyps.  Being older than 61 years of age.  Being younger than 60 years of age and having a significant family history of colorectal cancer or colon polyps or a genetic condition that puts you at higher risk of getting colon polyps.  Having inflammatory bowel disease, such as ulcerative colitis or Crohn's disease.  Having certain conditions passed from parent to child (hereditary conditions), such as: ? Familial adenomatous polyposis (FAP). ? Lynch syndrome. ? Turcot syndrome. ? Peutz-Jeghers syndrome. ? MUTYH-associated polyposis (MAP).  Being overweight.  Certain lifestyle factors. These include smoking cigarettes, drinking too much alcohol, not getting enough exercise, and eating a diet that is high in fat and red meat and low in fiber.  Having had childhood cancer that was treated with radiation of the abdomen. What are the signs or symptoms? Many times, there are no symptoms. If you have symptoms, they may include:  Blood coming from the rectum during a bowel movement.  Blood in the stool (feces). The blood may be bright red or very dark in color.  Pain in  the abdomen.  A change in bowel habits, such as constipation or diarrhea. How is this diagnosed? This condition is diagnosed with a colonoscopy. This is a procedure in which a lighted, flexible scope is inserted into the opening between the buttocks (anus) and then passed into the colon to examine the area. Polyps are sometimes found when a colonoscopy is done as part of routine cancer screening tests. How is this treated? This condition is treated by removing any polyps that are found. Most polyps can be removed during a colonoscopy. Those polyps will then be tested for cancer. Additional treatment may be needed depending on the results of testing. Follow these instructions at home: Eating and drinking  Eat foods that are high in fiber, such as fruits, vegetables, and whole grains.  Eat foods that are high in calcium and vitamin D, such as milk, cheese, yogurt, eggs, liver, fish, and broccoli.  Limit foods that are high in fat, such as fried foods and desserts.  Limit the amount of red meat, precooked or cured meat, or other processed meat that you eat, such as hot dogs, sausages, bacon, or meat loaves.  Limit sugary drinks.   Lifestyle  Maintain a healthy weight, or lose weight if recommended by your health care provider.  Exercise every day or as told by your health care provider.  Do not use any products that contain nicotine or tobacco, such as cigarettes, e-cigarettes, and chewing tobacco. If you need help quitting, ask your health care provider.  Do not drink alcohol if: ? Your health care provider tells  you not to drink. ? You are pregnant, may be pregnant, or are planning to become pregnant.  If you drink alcohol: ? Limit how much you use to:  0-1 drink a day for women.  0-2 drinks a day for men. ? Know how much alcohol is in your drink. In the [place] one drink equals one 12 oz bottle of beer (355 mL), one 5 oz glass of wine (148 mL), or one 1 oz glass of hard liquor (44  mL). General instructions  Take over-the-counter and prescription medicines only as told by your health care provider.  Keep all follow-up visits. This is important. This includes having regularly scheduled colonoscopies. Talk to your health care provider about when you need a colonoscopy. Contact a health care provider if:  You have new or worsening bleeding during a bowel movement.  You have new or increased blood in your stool.  You have a change in bowel habits.  You lose weight for no known reason. Summary  Colon polyps are tissue growths inside the colon, which is part of the large intestine. They are one type of polyp that can grow in the body.  Most colon polyps are noncancerous (benign), but some can become cancerous over time.  This condition is diagnosed with a colonoscopy.  This condition is treated by removing any polyps that are found. Most polyps can be removed during a colonoscopy. This information is not intended to replace advice given to you by your health care provider. Make sure you discuss any questions you have with your health care provider. Document Revised: 10/05/2019 Document Reviewed: 10/05/2019 Elsevier Patient Education  2021 [place].  Colonoscopy, Adult, Care After This sheet gives you information about how to care for yourself after your procedure. Your doctor may also give you more specific instructions. If you have problems or questions, call your doctor. What can I expect after the procedure? After the procedure, it is common to have:  A small amount of blood in your poop (stool) for 24 hours.  Some gas.  Mild cramping or bloating in your belly (abdomen). Follow these instructions at home: Eating and drinking  Drink enough fluid to keep your pee (urine) pale yellow.  Follow instructions from your doctor about what you cannot eat or drink.  Return to your normal diet as told by your doctor. Avoid heavy or fried foods that are hard  to digest.   Activity  Rest as told by your doctor.  Do not sit for a long time without moving. Get up to take short walks every 1-2 hours. This is important. Ask for help if you feel weak or unsteady.  Return to your normal activities as told by your doctor. Ask your doctor what activities are safe for you. To help cramping and bloating:  Try walking around.  Put heat on your belly as told by your doctor. Use the heat source that your doctor recommends, such as a moist heat pack or a heating pad. ? Put a towel between your skin and the heat source. ? Leave the heat on for 20-30 minutes. ? Remove the heat if your skin turns bright red. This is very important if you are unable to feel pain, heat, or cold. You may have a greater risk of getting burned.   General instructions  If you were given a medicine to help you relax (sedative) during your procedure, it can affect you for many hours. Do not drive or use machinery until your doctor  says that it is safe.  For the first 24 hours after the procedure: ? Do not sign important documents. ? Do not drink alcohol. ? Do your daily activities more slowly than normal. ? Eat foods that are soft and easy to digest.  Take over-the-counter or prescription medicines only as told by your doctor.  Keep all follow-up visits as told by your doctor. This is important. Contact a doctor if:  You have blood in your poop 2-3 days after the procedure. Get help right away if:  You have more than a small amount of blood in your poop.  You see large clumps of tissue (blood clots) in your poop.  Your belly is swollen.  You feel like you may vomit (nauseous).  You vomit.  You have a fever.  You have belly pain that gets worse, and medicine does not help your pain. Summary  After the procedure, it is common to have a small amount of blood in your poop. You may also have mild cramping and bloating in your belly.  If you were given a medicine to  help you relax (sedative) during your procedure, it can affect you for many hours. Do not drive or use machinery until your doctor says that it is safe.  Get help right away if you have a lot of blood in your poop, feel like you may vomit, have a fever, or have more belly pain. This information is not intended to replace advice given to you by your health care provider. Make sure you discuss any questions you have with your health care provider. Document Revised: 04/22/2019 Document Reviewed: 01/10/2019 Elsevier Patient Education  [place].

## 2020-11-15 NOTE — Op Note (Signed)
[facility] Patient Name: Terry Novak Procedure Date: 11/15/2020 8:27 AM MRN: [id number] Date of Birth: March 22, 1961 Attending MD: Hildred Laser , MD CSN: [id number] Age: 60 Admit Type: Outpatient Procedure:                Colonoscopy Indications:              Screening for colorectal malignant neoplasm Providers:                Hildred Laser, MD, Lambert Mody, Aram Candela Referring MD:             Hurshel Party, MD Medicines:                Meperidine 50 mg IV, Midazolam 5 mg IV Complications:            No immediate complications. Estimated Blood Loss:     Estimated blood loss was minimal. Procedure:                Pre-Anesthesia Assessment:                           - Prior to the procedure, a History and Physical                            was performed, and patient medications and                            allergies were reviewed. The patient's tolerance of                            previous anesthesia was also reviewed. The risks                            and benefits of the procedure and the sedation                            options and risks were discussed with the patient.                            All questions were answered, and informed consent                            was obtained. Prior Anticoagulants: The patient has                            taken no previous anticoagulant or antiplatelet                            agents. ASA Grade Assessment: I - A normal, healthy                            patient. After reviewing the risks and benefits,                            the patient was deemed in satisfactory condition to  undergo the procedure.                           After obtaining informed consent, the colonoscope                            was passed under direct vision. Throughout the                            procedure, the patient's blood pressure, pulse, and                            oxygen saturations were  monitored continuously. The                            [id number]) was introduced through the anus                            and advanced to the the cecum, identified by                            appendiceal orifice and ileocecal valve. The                            colonoscopy was performed without difficulty. The                            patient tolerated the procedure well. The quality                            of the bowel preparation was good. The ileocecal                            valve, appendiceal orifice, and rectum were                            photographed. Scope In: 8:55:10 AM Scope Out: 9:13:18 AM Scope Withdrawal Time: 0 hours 12 minutes 35 seconds  Total Procedure Duration: 0 hours 18 minutes 8 seconds  Findings:      The perianal and digital rectal examinations were normal.      A 5 mm polyp was found in the transverse colon. The polyp was sessile.       The polyp was removed with a cold snare. Resection and retrieval were       complete. The pathology specimen was placed into Bottle Number 1.      A small polyp was found in the recto-sigmoid colon. The polyp was       removed with a cold snare. Resection and retrieval were complete. The       pathology specimen was placed into Bottle Number 1.      A diminutive polyp was found in the rectum. The polyp was sessile.       Biopsies were taken with a cold forceps for histology. The pathology       specimen was placed into Bottle Number 1.      External hemorrhoids were found  during retroflexion. The hemorrhoids       were small. Impression:               - One 5 mm polyp in the transverse colon, removed                            with a cold snare. Resected and retrieved.                           - One small polyp at the recto-sigmoid colon,                            removed with a cold snare. Resected and retrieved.                           - One diminutive polyp in the rectum. Biopsied.                            - External hemorrhoids. Moderate Sedation:      Moderate (conscious) sedation was administered by the endoscopy nurse       and supervised by the endoscopist. The following parameters were       monitored: oxygen saturation, heart rate, blood pressure, CO2       capnography and response to care. Total physician intraservice time was       22 minutes. Recommendation:           - Patient has a contact number available for                            emergencies. The signs and symptoms of potential                            delayed complications were discussed with the                            patient. Return to normal activities tomorrow.                            Written discharge instructions were provided to the                            patient.                           - Resume previous diet today.                           - Continue present medications.                           - No aspirin, ibuprofen, naproxen, or other                            non-steroidal anti-inflammatory drugs for 1 day.                           -  Await pathology results.                           - Repeat colonoscopy is recommended. The                            colonoscopy date will be determined after pathology                            results from today's exam become available for                            review. Procedure Code(s):        --- Professional ---                           [phone], Colonoscopy, flexible; with removal of                            tumor(s), polyp(s), or other lesion(s) by snare                            technique                           45380, 59, Colonoscopy, flexible; with biopsy,                            single or multiple                           G0500, Moderate sedation services provided by the                            same physician or other qualified health care                            professional performing a gastrointestinal                             endoscopic service that sedation supports,                            requiring the presence of an independent trained                            observer to assist in the monitoring of the                            patient's level of consciousness and physiological                            status; initial 15 minutes of intra-service time;                            patient age 31 years or older (additional  time may                            be reported with [id number], as appropriate) Diagnosis Code(s):        --- Professional ---                           Z12.11, Encounter for screening for malignant                            neoplasm of colon                           K63.5, Polyp of colon                           K62.1, Rectal polyp                           K64.4, Residual hemorrhoidal skin tags CPT copyright 2019 American [employer]. All rights reserved. The codes documented in this report are preliminary and upon coder review may  be revised to meet current compliance requirements. Hildred Laser, MD Hildred Laser, MD 11/15/2020 9:20:56 AM This report has been signed electronically. Number of Addenda: 0

## 2020-11-15 NOTE — H&P (Signed)
Terry Novak is an 60 y.o. male.   Chief Complaint: Patient is here for colonoscopy HPI: Patient is 60 year old African-American male who is here for screening colonoscopy.  This is patient's first exam.  He denies abdominal pain change in bowel habits or rectal bleeding.  He does not take any prescription medication.  Patient says his PSA was mildly elevated but prostate biopsy was negative. Family history is negative for CRC.  History reviewed. No pertinent past medical history.  History reviewed. No pertinent surgical history.  Family History  Problem Relation Age of Onset  . Diabetes Mother   . Heart disease Mother   . Stroke Mother   . Migraines Father   . Cancer Father        prostate   Social History:  reports that he has never smoked. He has never used smokeless tobacco. He reports current alcohol use of about 1.0 standard drink of alcohol per week. He reports that he does not use drugs.  Allergies:  Allergies  Allergen Reactions  . Penicillins Rash    Medications Prior to Admission  Medication Sig Dispense Refill  . Cholecalciferol (VITAMIN D3 PO) Take 1 tablet by mouth in the morning.    . Maca Root (MACA PO) Take 3 g by mouth daily. 1 teaspoon    . Pyridoxine HCl (VITAMIN B-6 PO) Take 1 tablet by mouth in the morning.    . WHEY PROTEIN PO Take 1 Dose by mouth See admin instructions. Take 1 shake pre & post workout (2x's daily with workout)    . polyethylene glycol-electrolytes (NULYTELY) 420 g solution Take 4,000 mLs by mouth as directed.      No results found for this or any previous visit (from the past 48 hour(s)). No results found.  Review of Systems  Blood pressure (!) 132/96, pulse 92, temperature 97.8 F (36.6 C), temperature source Oral, resp. rate 18, height 5\' 11"  (1.803 m), weight 78.5 kg, SpO2 100 %. Physical Exam HENT:     Mouth/Throat:     Mouth: Mucous membranes are moist.     Pharynx: Oropharynx is clear.  Eyes:     General: No scleral  icterus.    Conjunctiva/sclera: Conjunctivae normal.  Cardiovascular:     Rate and Rhythm: Normal rate and regular rhythm.     Heart sounds: No murmur heard.   Pulmonary:     Effort: Pulmonary effort is normal.     Breath sounds: Normal breath sounds.  Abdominal:     General: There is no distension.     Palpations: Abdomen is soft.     Tenderness: There is no abdominal tenderness.  Musculoskeletal:        General: No swelling.     Cervical back: Neck supple.  Lymphadenopathy:     Cervical: No cervical adenopathy.  Skin:    General: Skin is warm and dry.  Neurological:     Mental Status: He is alert.      Assessment/Plan  Average risk screening colonoscopy  Hildred Laser, MD 11/15/2020, 8:42 AM

## 2020-11-16 LAB — SURGICAL PATHOLOGY

## 2020-11-20 ENCOUNTER — Encounter (INDEPENDENT_AMBULATORY_CARE_PROVIDER_SITE_OTHER): Payer: Self-pay | Admitting: *Deleted

## 2020-11-21 ENCOUNTER — Encounter (HOSPITAL_COMMUNITY): Payer: Self-pay | Admitting: Internal Medicine

## 2020-11-22 ENCOUNTER — Encounter (INDEPENDENT_AMBULATORY_CARE_PROVIDER_SITE_OTHER): Payer: BC Managed Care – PPO | Admitting: Internal Medicine

## 2021-03-27 ENCOUNTER — Encounter (INDEPENDENT_AMBULATORY_CARE_PROVIDER_SITE_OTHER): Payer: BC Managed Care – PPO | Admitting: Internal Medicine

## 2022-04-13 IMAGING — MR MR PROSTATE WO/W CM
56 series · 56 of 56 positions shown · IV contrast (17ml Multihance)
Comparison: None

CLINICAL DATA: PSA elevation, most recently

EXAM:
MR PROSTATE WITHOUT AND WITH CONTRAST
TECHNIQUE: Multiplanar multisequence MRI images were obtained of the pelvis
centered about the prostate. Pre and post contrast images were
obtained.
CONTRAST:  17mL MULTIHANCE GADOBENATE DIMEGLUMINE 529 MG/ML IV SOLN

[Series 3: bSSFP fat-sat · axial · 8.0mm · 0.74mm/px · 1 of 28 slices shown]
[im 1/28]
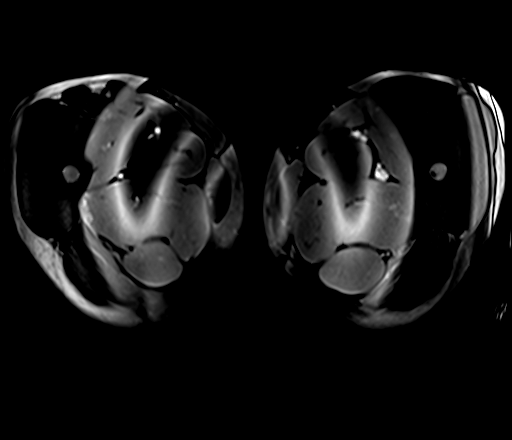

[Series 4: T1 · axial · 5.0mm · 1.25mm/px · 1 of 80 slices shown]
[im 1/80]
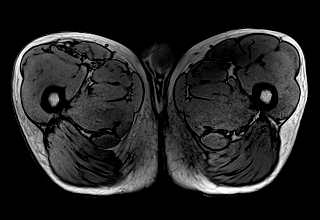

[Series 5: T2 · coronal · 3.5mm · 0.56mm/px · 1 of 27 slices shown (1 of 3)]
[im 1/27]
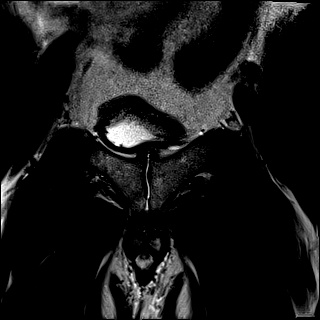

[Series 6: DWI · axial · 3.5mm · 1.75mm/px · 1 of 72 slices shown (1 of 3)]
[im 1/72]
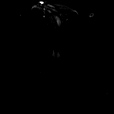

[Series 7: DWI · axial · 3.5mm · 1.75mm/px · 1 of 24 slices shown (2 of 3)]
[im 1/24]
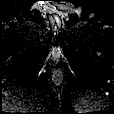

[Series 8: DWI · axial · 3.5mm · 1.56mm/px · 1 of 24 slices shown (3 of 3)]
[im 1/24]
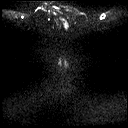

[Series 9: T2 · axial · 3.5mm · 0.56mm/px · 1 of 25 slices shown (2 of 3)]
[im 1/25]
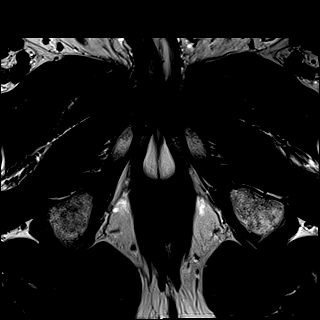

[Series 10: T2 · axial · 1.0mm · 1.04mm/px · 1 of 80 slices shown (3 of 3)]
[im 1/80]
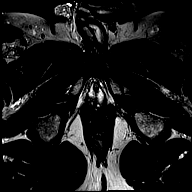

[Series 11: pre t1_twist_tra_dyn_ttc=6.4s · axial · non-contrast · 3.5mm · 0.83mm/px · 1 of 24 slices shown]
[im 1/24]
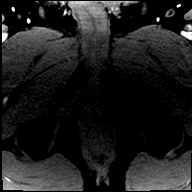

[Series 12: post t1_twist_tra_dyn-copy center · axial · 3.5mm · 0.83mm/px · 1 of 24 slices shown (1 of 24)]
[im 1/24]
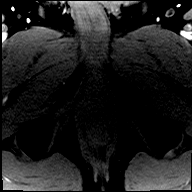

[Series 13: post t1_twist_tra_dyn-copy center · axial · 3.5mm · 0.83mm/px · 1 of 24 slices shown (2 of 24)]
[im 1/24]
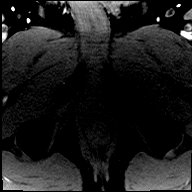

[Series 14: post t1_twist_tra_dyn-copy cent_sub_ttc=(id) · axial · 3.5mm · 0.83mm/px · 1 of 24 slices shown (1 of 23)]
[im 1/24]
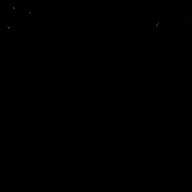

[Series 15: post t1_twist_tra_dyn-copy center · axial · 3.5mm · 0.83mm/px · 1 of 24 slices shown (3 of 24)]
[im 1/24]
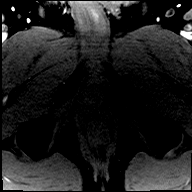

[Series 16: post t1_twist_tra_dyn-copy cent_sub_ttc=(id) · axial · 3.5mm · 0.83mm/px · 1 of 24 slices shown (2 of 23)]
[im 1/24]
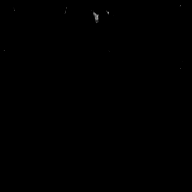

[Series 17: post t1_twist_tra_dyn-copy center · axial · 3.5mm · 0.83mm/px · 1 of 24 slices shown (4 of 24)]
[im 1/24]
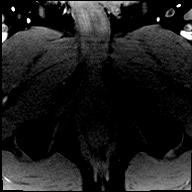

[Series 18: post t1_twist_tra_dyn-copy cent_sub_ttc=(id) · axial · 3.5mm · 0.83mm/px · 1 of 24 slices shown (3 of 23)]
[im 1/24]
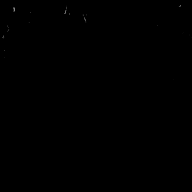

[Series 19: post t1_twist_tra_dyn-copy center · axial · 3.5mm · 0.83mm/px · 1 of 24 slices shown (5 of 24)]
[im 1/24]
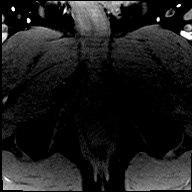

[Series 20: post t1_twist_tra_dyn-copy cent_sub_ttc=(id) · axial · 3.5mm · 0.83mm/px · 1 of 24 slices shown (4 of 23)]
[im 1/24]
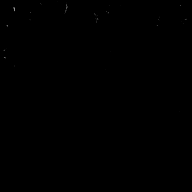

[Series 21: post t1_twist_tra_dyn-copy center · axial · 3.5mm · 0.83mm/px · 1 of 24 slices shown (6 of 24)]
[im 1/24]
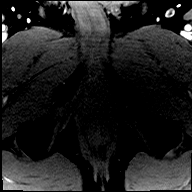

[Series 22: post t1_twist_tra_dyn-copy cent_sub_ttc=(id) · axial · 3.5mm · 0.83mm/px · 1 of 24 slices shown (5 of 23)]
[im 1/24]
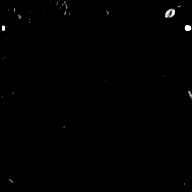

[Series 23: post t1_twist_tra_dyn-copy center · axial · 3.5mm · 0.83mm/px · 1 of 24 slices shown (7 of 24)]
[im 1/24]
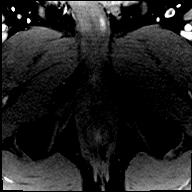

[Series 24: post t1_twist_tra_dyn-copy cent_sub_ttc=(id) · axial · 3.5mm · 0.83mm/px · 1 of 24 slices shown (6 of 23)]
[im 1/24]
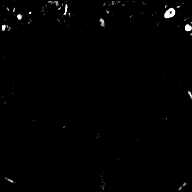

[Series 25: post t1_twist_tra_dyn-copy center · axial · 3.5mm · 0.83mm/px · 1 of 24 slices shown (8 of 24)]
[im 1/24]
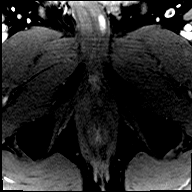

[Series 26: post t1_twist_tra_dyn-copy cent_sub_ttc=(id) · axial · 3.5mm · 0.83mm/px · 1 of 24 slices shown (7 of 23)]
[im 1/24]
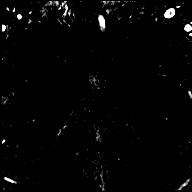

[Series 27: post t1_twist_tra_dyn-copy center · axial · 3.5mm · 0.83mm/px · 1 of 24 slices shown (9 of 24)]
[im 1/24]
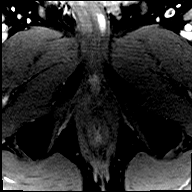

[Series 28: post t1_twist_tra_dyn-copy cent_sub_ttc=(id) · axial · 3.5mm · 0.83mm/px · 1 of 24 slices shown (8 of 23)]
[im 1/24]
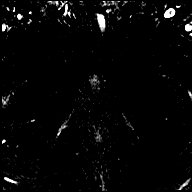

[Series 29: post t1_twist_tra_dyn-copy center · axial · 3.5mm · 0.83mm/px · 1 of 24 slices shown (10 of 24)]
[im 1/24]
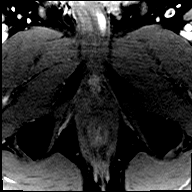

[Series 30: post t1_twist_tra_dyn-copy cent_sub_ttc=(id) · axial · 3.5mm · 0.83mm/px · 1 of 24 slices shown (9 of 23)]
[im 1/24]
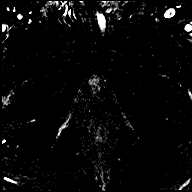

[Series 31: post t1_twist_tra_dyn-copy center · axial · 3.5mm · 0.83mm/px · 1 of 24 slices shown (11 of 24)]
[im 1/24]
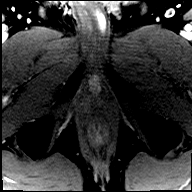

[Series 32: post t1_twist_tra_dyn-copy cent_sub_ttc=(id) · axial · 3.5mm · 0.83mm/px · 1 of 24 slices shown (10 of 23)]
[im 1/24]
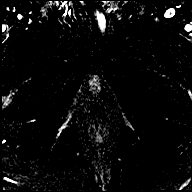

[Series 33: post t1_twist_tra_dyn-copy center · axial · 3.5mm · 0.83mm/px · 1 of 24 slices shown (12 of 24)]
[im 1/24]
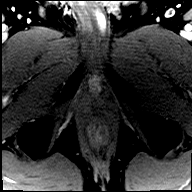

[Series 34: post t1_twist_tra_dyn-copy cent_sub_ttc=(id) · axial · 3.5mm · 0.83mm/px · 1 of 24 slices shown (11 of 23)]
[im 1/24]
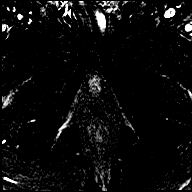

[Series 35: post t1_twist_tra_dyn-copy center · axial · 3.5mm · 0.83mm/px · 1 of 24 slices shown (13 of 24)]
[im 1/24]
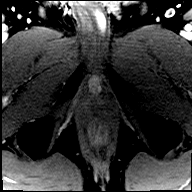

[Series 36: post t1_twist_tra_dyn-copy cent_sub_ttc=(id) · axial · 3.5mm · 0.83mm/px · 1 of 24 slices shown (12 of 23)]
[im 1/24]
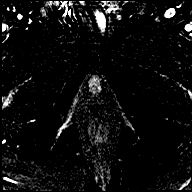

[Series 37: post t1_twist_tra_dyn-copy center · axial · 3.5mm · 0.83mm/px · 1 of 24 slices shown (14 of 24)]
[im 1/24]
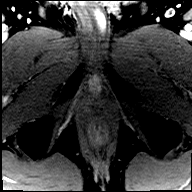

[Series 38: post t1_twist_tra_dyn-copy cent_sub_ttc=(id) · axial · 3.5mm · 0.83mm/px · 1 of 24 slices shown (13 of 23)]
[im 1/24]
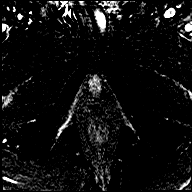

[Series 39: post t1_twist_tra_dyn-copy center · axial · 3.5mm · 0.83mm/px · 1 of 24 slices shown (15 of 24)]
[im 1/24]
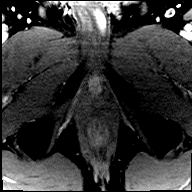

[Series 40: post t1_twist_tra_dyn-copy cent_sub_ttc=(id) · axial · 3.5mm · 0.83mm/px · 1 of 24 slices shown (14 of 23)]
[im 1/24]
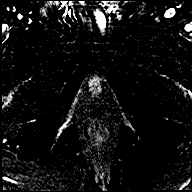

[Series 41: post t1_twist_tra_dyn-copy center · axial · 3.5mm · 0.83mm/px · 1 of 24 slices shown (16 of 24)]
[im 1/24]
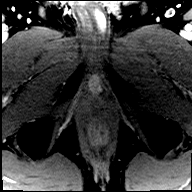

[Series 42: post t1_twist_tra_dyn-copy cent_sub_ttc=(id) · axial · 3.5mm · 0.83mm/px · 1 of 24 slices shown (15 of 23)]
[im 1/24]
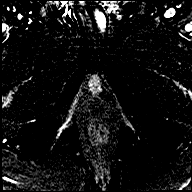

[Series 43: post t1_twist_tra_dyn-copy center · axial · 3.5mm · 0.83mm/px · 1 of 24 slices shown (17 of 24)]
[im 1/24]
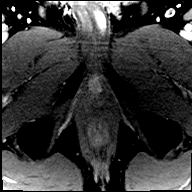

[Series 44: post t1_twist_tra_dyn-copy cent_sub_ttc=(id) · axial · 3.5mm · 0.83mm/px · 1 of 24 slices shown (16 of 23)]
[im 1/24]
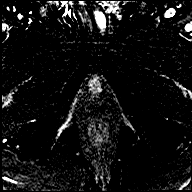

[Series 45: post t1_twist_tra_dyn-copy center · axial · 3.5mm · 0.83mm/px · 1 of 24 slices shown (18 of 24)]
[im 1/24]
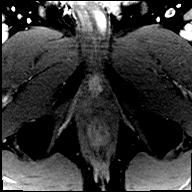

[Series 46: post t1_twist_tra_dyn-copy cent_sub_ttc=(id) · axial · 3.5mm · 0.83mm/px · 1 of 24 slices shown (17 of 23)]
[im 1/24]
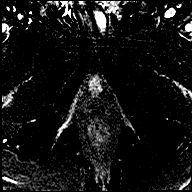

[Series 47: post t1_twist_tra_dyn-copy center · axial · 3.5mm · 0.83mm/px · 1 of 24 slices shown (19 of 24)]
[im 1/24]
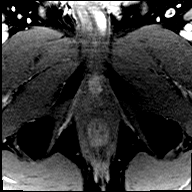

[Series 48: post t1_twist_tra_dyn-copy cent_sub_ttc=(id) · axial · 3.5mm · 0.83mm/px · 1 of 24 slices shown (18 of 23)]
[im 1/24]
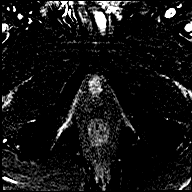

[Series 49: post t1_twist_tra_dyn-copy center · axial · 3.5mm · 0.83mm/px · 1 of 24 slices shown (20 of 24)]
[im 1/24]
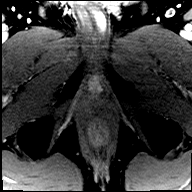

[Series 50: post t1_twist_tra_dyn-copy cent_sub_ttc=(id) · axial · 3.5mm · 0.83mm/px · 1 of 24 slices shown (19 of 23)]
[im 1/24]
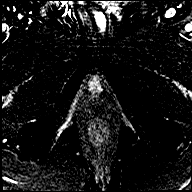

[Series 51: post t1_twist_tra_dyn-copy center · axial · 3.5mm · 0.83mm/px · 1 of 24 slices shown (21 of 24)]
[im 1/24]
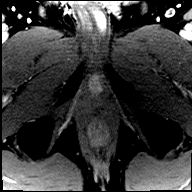

[Series 52: post t1_twist_tra_dyn-copy cent_sub_ttc=(id) · axial · 3.5mm · 0.83mm/px · 1 of 24 slices shown (20 of 23)]
[im 1/24]
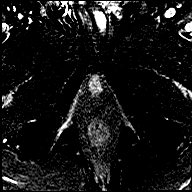

[Series 53: post t1_twist_tra_dyn-copy center · axial · 3.5mm · 0.83mm/px · 1 of 24 slices shown (22 of 24)]
[im 1/24]
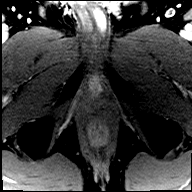

[Series 54: post t1_twist_tra_dyn-copy cent_sub_ttc=(id) · axial · 3.5mm · 0.83mm/px · 1 of 24 slices shown (21 of 23)]
[im 1/24]
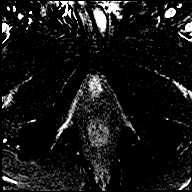

[Series 55: post t1_twist_tra_dyn-copy center · axial · 3.5mm · 0.83mm/px · 1 of 24 slices shown (23 of 24)]
[im 1/24]
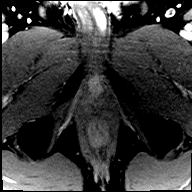

[Series 56: post t1_twist_tra_dyn-copy cent_sub_ttc=(id) · axial · 3.5mm · 0.83mm/px · 1 of 24 slices shown (22 of 23)]
[im 1/24]
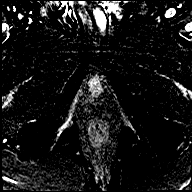

[Series 57: post t1_twist_tra_dyn-copy center · axial · 3.5mm · 0.83mm/px · 1 of 24 slices shown (24 of 24)]
[im 1/24]
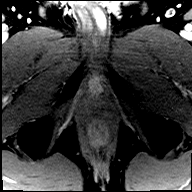

[Series 58: post t1_twist_tra_dyn-copy cent_sub_ttc=(id) · axial · 3.5mm · 0.83mm/px · 1 of 24 slices shown (23 of 23)]
[im 1/24]
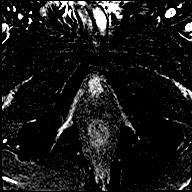

[56 of 56 positions shown; findings below may reference images not displayed]

FINDINGS: Prostate:

Transitional zone: Nodular BPH changes without sign of high-risk
lesion.

Peripheral zone: No sign of restricted diffusion in the peripheral
zone which is nearly homogeneously T2 hyperintense. Preservation of
the peripheral zone despite moderate BPH.

Volume: 5.0 x 4.5 x 5.1 (volume = 60 cc) cm

Transcapsular spread:  Absent

Seminal vesicle involvement: Absent

Neurovascular bundle involvement: Absent

Pelvic adenopathy: Absent

Bone metastasis: Absent

Other findings: None
IMPRESSION: 1. No sign of high-risk disease in the prostate with changes of BPH.

## 2023-04-28 ENCOUNTER — Other Ambulatory Visit: Payer: Self-pay | Admitting: Urology

## 2023-04-28 DIAGNOSIS — Z8042 Family history of malignant neoplasm of prostate: Secondary | ICD-10-CM

## 2023-04-28 DIAGNOSIS — R972 Elevated prostate specific antigen [PSA]: Secondary | ICD-10-CM

## 2023-05-27 ENCOUNTER — Inpatient Hospital Stay
Admission: RE | Admit: 2023-05-27 | Discharge: 2023-05-27 | Discharge status: Home / Self Care | Payer: BC Managed Care – PPO | Source: Ambulatory Visit | Attending: Urology

## 2023-05-27 DIAGNOSIS — Z8042 Family history of malignant neoplasm of prostate: Secondary | ICD-10-CM

## 2023-05-27 DIAGNOSIS — R972 Elevated prostate specific antigen [PSA]: Secondary | ICD-10-CM

## 2023-05-27 MED ORDER — GADOPICLENOL 0.5 MMOL/ML IV SOLN
9.0000 mL | Freq: Once | INTRAVENOUS | Status: AC | PRN
  Administered 2023-05-27: 9 mL via INTRAVENOUS

## 2023-06-17 ENCOUNTER — Other Ambulatory Visit: Payer: BC Managed Care – PPO

## 2023-06-25 ENCOUNTER — Other Ambulatory Visit: Payer: BC Managed Care – PPO
